# Patient Record
Sex: Male | Born: 1985 | Hispanic: No | Marital: Single | Smoking: Never smoker
Health system: Southern US, Community
[De-identification: ages and names within clinical notes are randomized; demographics above are authoritative.]

## PROBLEM LIST (undated history)

## (undated) DIAGNOSIS — E782 Mixed hyperlipidemia: Secondary | ICD-10-CM

## (undated) DIAGNOSIS — M549 Dorsalgia, unspecified: Secondary | ICD-10-CM

## (undated) HISTORY — DX: Dorsalgia, unspecified: M54.9

## (undated) HISTORY — DX: Mixed hyperlipidemia: E78.2

---

## 2013-02-01 ENCOUNTER — Encounter (HOSPITAL_COMMUNITY): Payer: Self-pay

## 2013-02-01 ENCOUNTER — Emergency Department (INDEPENDENT_AMBULATORY_CARE_PROVIDER_SITE_OTHER): Payer: BC Managed Care – PPO

## 2013-02-01 ENCOUNTER — Emergency Department (HOSPITAL_COMMUNITY)
Admission: EM | Admit: 2013-02-01 | Discharge: 2013-02-01 | Disposition: A | Discharge status: Home / Self Care | Payer: BC Managed Care – PPO | Attending: Emergency Medicine | Admitting: Emergency Medicine

## 2013-02-01 ENCOUNTER — Emergency Department (HOSPITAL_COMMUNITY): Payer: BC Managed Care – PPO

## 2013-02-01 ENCOUNTER — Encounter (HOSPITAL_COMMUNITY): Payer: Self-pay | Admitting: Emergency Medicine

## 2013-02-01 ENCOUNTER — Emergency Department (HOSPITAL_COMMUNITY)
Admission: EM | Admit: 2013-02-01 | Discharge: 2013-02-01 | Disposition: A | Discharge status: Nursing Home (Includes ICF) | Payer: BC Managed Care – PPO | Source: Home / Self Care | Attending: Emergency Medicine | Admitting: Emergency Medicine

## 2013-02-01 DIAGNOSIS — J9383 Other pneumothorax: Secondary | ICD-10-CM

## 2013-02-01 DIAGNOSIS — R319 Hematuria, unspecified: Secondary | ICD-10-CM | POA: Insufficient documentation

## 2013-02-01 DIAGNOSIS — R109 Unspecified abdominal pain: Secondary | ICD-10-CM

## 2013-02-01 DIAGNOSIS — R1013 Epigastric pain: Secondary | ICD-10-CM

## 2013-02-01 DIAGNOSIS — Z79899 Other long term (current) drug therapy: Secondary | ICD-10-CM | POA: Insufficient documentation

## 2013-02-01 DIAGNOSIS — K219 Gastro-esophageal reflux disease without esophagitis: Secondary | ICD-10-CM

## 2013-02-01 DIAGNOSIS — J939 Pneumothorax, unspecified: Secondary | ICD-10-CM

## 2013-02-01 DIAGNOSIS — M549 Dorsalgia, unspecified: Secondary | ICD-10-CM

## 2013-02-01 LAB — POCT URINALYSIS DIP (DEVICE)
Glucose, UA: NEGATIVE mg/dL
Nitrite: NEGATIVE
Protein, ur: NEGATIVE mg/dL
Urobilinogen, UA: 0.2 mg/dL (ref 0.0–1.0)

## 2013-02-01 LAB — POCT I-STAT, CHEM 8
Creatinine, Ser: 1.1 mg/dL (ref 0.50–1.35)
Hemoglobin: 15 g/dL (ref 13.0–17.0)
Potassium: 4 mEq/L (ref 3.5–5.1)
Sodium: 141 mEq/L (ref 135–145)
TCO2: 24 mmol/L (ref 0–100)

## 2013-02-01 LAB — CBC WITH DIFFERENTIAL/PLATELET
Basophils Absolute: 0 10*3/uL (ref 0.0–0.1)
Eosinophils Absolute: 0.1 10*3/uL (ref 0.0–0.7)
Eosinophils Relative: 2 % (ref 0–5)
Lymphocytes Relative: 31 % (ref 12–46)
Lymphs Abs: 2.1 10*3/uL (ref 0.7–4.0)
MCV: 84.7 fL (ref 78.0–100.0)
Neutrophils Relative %: 57 % (ref 43–77)
Platelets: 174 10*3/uL (ref 150–400)
RBC: 4.78 MIL/uL (ref 4.22–5.81)
RDW: 13 % (ref 11.5–15.5)
WBC: 6.7 10*3/uL (ref 4.0–10.5)

## 2013-02-01 LAB — POCT H PYLORI SCREEN: H. PYLORI SCREEN, POC: NEGATIVE

## 2013-02-01 MED ORDER — PANTOPRAZOLE SODIUM 20 MG PO TBEC: 20.0000 mg | DELAYED_RELEASE_TABLET | Freq: Every day | ORAL | Status: DC

## 2013-02-01 MED ORDER — ACETAMINOPHEN 325 MG PO TABS
650.0000 mg | ORAL_TABLET | Freq: Once | ORAL | Status: AC
  Administered 2013-02-01: 650 mg via ORAL

## 2013-02-01 MED ORDER — ACETAMINOPHEN 325 MG PO TABS
ORAL_TABLET | ORAL | Status: AC
  Filled 2013-02-01: qty 2

## 2013-02-01 MED ORDER — GI COCKTAIL ~~LOC~~
30.0000 mL | Freq: Once | ORAL | Status: AC
  Administered 2013-02-01: 30 mL via ORAL

## 2013-02-01 MED ORDER — GI COCKTAIL ~~LOC~~
ORAL | Status: AC
  Filled 2013-02-01: qty 30

## 2013-02-01 NOTE — ED Notes (Signed)
Transfer from [facility]. Report given by Cherly Anderson. Pt presents with back pain that radiates to abdomen for "a long time". Urine dip stick performed at [facility] for Hgb. Acute abdomen and chest xray showed small pneumothorax. Pt denies CP and SOB. H.Pylori performed and was negative. I stat chem 8 result Ca+ 1.31. CBC WNL. Pt vital signs: 124/79, temp. 99.4 orally, HR 84, RR 16, O2 sats 100%. Pt given 2 regular tylenol and a GI Cocktail.

## 2013-02-01 NOTE — ED Notes (Signed)
C/o back pain radiating to abd area for a long time.  No treatment taken

## 2013-02-01 NOTE — ED Notes (Addendum)
Patient presents via [facility] for further evaluation of epigastric and lower back pain x 1 month. A abdomen w/ chest xray was done that found a right pneumothorax. Patient denies any CP or SOB. Denies any recent falls or trauma. Patient did travel via car to [place] about a week ago.   CBC, H. Pylori screen, I-stat Chem 8 and U/A completed at urgent care.

## 2013-02-01 NOTE — ED Provider Notes (Signed)
CSN: [id number]     Arrival date & time 02/01/13  2001 History     First MD Initiated Contact with Patient 02/01/13 2055     Chief Complaint  Patient presents with  . Spontaneous Pneumothorax   (Consider location/radiation/quality/duration/timing/severity/associated sxs/prior Treatment) HPI Comments: 27 y.o. M transferred from [facility] due to possible R apical PTX found incidentally on acute abdominal series.  Pt initially presented to [facility] due to abdominal pain x 1 month.  Patient is a 27 y.o. male presenting with abdominal pain.  Abdominal Pain Pain location:  Epigastric Pain quality: burning   Pain radiates to:  Does not radiate Pain severity:  Mild Onset quality:  Gradual Duration:  4 weeks Timing:  Intermittent Progression:  Unchanged Chronicity:  Chronic Context: eating   Context: not alcohol use, not awakening from sleep, not recent illness, not retching, not sick contacts, not suspicious food intake and not trauma   Relieved by:  None tried Worsened by:  Nothing tried Ineffective treatments:  None tried Associated symptoms: no anorexia, no chest pain, no chills, no constipation, no cough, no diarrhea, no dysuria, no fatigue, no fever, no hematemesis, no hematochezia, no hematuria, no melena, no nausea, no shortness of breath and no vomiting     History reviewed. No pertinent past medical history. History reviewed. No pertinent past surgical history. No family history on file. History  Substance Use Topics  . Smoking status: Never Smoker   . Smokeless tobacco: Never Used  . Alcohol Use: No    Review of Systems  Constitutional: Negative for fever, chills, diaphoresis, appetite change and fatigue.  HENT: Negative for congestion, rhinorrhea, neck pain and neck stiffness.   Respiratory: Negative for cough, chest tightness, shortness of breath, wheezing and stridor.   Cardiovascular: Negative for chest pain.  Gastrointestinal: Positive for abdominal pain. Negative for  nausea, vomiting, diarrhea, constipation, blood in stool, melena, hematochezia, abdominal distention, anorexia and hematemesis.  Genitourinary: Negative for dysuria, urgency, frequency, hematuria, decreased urine volume, difficulty urinating and penile pain.  Musculoskeletal: Negative for myalgias, back pain, joint swelling and arthralgias.  Skin: Negative for color change, pallor and rash.  Neurological: Negative for dizziness, light-headedness and headaches.  All other systems reviewed and are negative.    Allergies  Review of patient's allergies indicates no known allergies.  Home Medications   Current Outpatient Rx  Name  Route  Sig  Dispense  Refill  . pantoprazole (PROTONIX) 20 MG tablet   Oral   Take 1 tablet (20 mg total) by mouth daily.   30 tablet   0    BP 138/91  Pulse 66  Temp(Src) 97.8 F (36.6 C) (Oral)  Resp 16  SpO2 100% Physical Exam  Nursing note and vitals reviewed. Constitutional: He is oriented to person, place, and time. He appears well-developed and well-nourished. No distress.  HENT:  Head: Normocephalic and atraumatic.  Eyes: EOM are normal. Pupils are equal, round, and reactive to light.  Neck: Normal range of motion. Neck supple.  Cardiovascular: Normal rate, regular rhythm, normal heart sounds and intact distal pulses.   Pulmonary/Chest: Effort normal and breath sounds normal. No respiratory distress. He has no wheezes. He has no rales.  Abdominal: Soft. Normal appearance and bowel sounds are normal. He exhibits no distension. There is no tenderness. There is no rigidity, no rebound, no guarding, no CVA tenderness, no tenderness at McBurney's point and negative Murphy's sign.  Musculoskeletal: Normal range of motion. He exhibits no edema and no tenderness.  Neurological:  He is alert and oriented to person, place, and time. He has normal strength. He exhibits normal muscle tone. Coordination normal.  Skin: Skin is warm and dry. No rash noted. He is  not diaphoretic.    ED Course   Procedures (including critical care time)  Labs Reviewed - No data to display Dg Chest 1 View  02/01/2013   *RADIOLOGY REPORT*  Clinical Data: Reevaluate for possible pneumothorax  CHEST - 1 VIEW  Comparison: Single chest radiograph same date  Findings: Cardiomediastinal silhouette is within normal limits. The lungs are clear. No pleural effusion.  No pneumothorax.  No acute osseous abnormality.  No specific evidence for right apical pneumothorax.  IMPRESSION: No acute abnormality.  The previously questioned pneumothorax appears to have been artifactual.   Original Report Authenticated By: Christiana Pellant, M.D.   Dg Abd Acute W/chest  02/01/2013   *RADIOLOGY REPORT*  Clinical Data: Abdominal and back pain for 1 month  ACUTE ABDOMEN SERIES (ABDOMEN 2 VIEW & CHEST 1 VIEW)  Comparison: None.  Findings: Normal heart size.  Clear lungs. Linear interface at the right apex is present.  Pneumothorax is not excluded.  There is no free intraperitoneal gas.  No disproportionate dilatation of bowel.  Unremarkable soft tissues peri  IMPRESSION: Nonobstructive bowel gas pattern.  Possible right apical pneumothorax.  Upright PA chest is recommended to further characterize.   Original Report Authenticated By: Jolaine Click, M.D.   1. Hematuria   2. GERD (gastroesophageal reflux disease)     MDM  27 y.o. M transferred from [facility] due to possible R apical PTX found as incidental finding on acute abdominal series.  Pt has had no complaints of chest pain or dyspnea, no cough or pleuritic pain.  No hypoxia noted at [facility].    Pt presented to [facility] due to 1 month of epigastric abdominal, lower abdominal, and low back pain.  He was evaluated at [facility] where labwork revealed moderate hematuria.  Chem 8 and CBC was WNL. H.pylori was negative. Pt reports epigastric pain is burning in nature, worse with cold foods or liquids.  He states he has had back and abdominal pain intermittently for the past  month.  Pain bilateral, with radiation to suprapubic area.  He also reports occasional diarrhea although denies this currently.  He denies dysuria, hematuria, hematochezia, or melena.  Denies N/V.  Acute abd series was obtained which incidentally showed possible R apical PTX.    On arrival, pt AFVSS, no tachypnea or hypoxia, O2 sats 100% on RA.  On exam, lung sounds clear and equal bilaterally.  Will repeat upright PA CXR to reassess for PTX.  Abdominal exam with no acute findings, non-tender with no rebound or guarding.  No CVA TTP. U/A from [facility] with hematuria but otherwise with no evidence of infection.  No focal RLQ TTP, rebound, guarding, or leukocytosis to suggest appendicitis.  Murphy's sign negative, doubt cholecystitis.     CXR normal, previously noted possible PTX appears to be artifact.  Pt stable for discharge.  Abdominal exam benign, symptoms consistent with GERD.  Pt prescribed PPI and advised f/u with PCP.  Hematuria on U/A may be 2/2 pt's heavy lifting and physical activity he does for work.  Contact info provided for PCP.  ED return precautions given.  Pt stable at discharge.  Discussed with attending Dr. Romeo Apple.  Jodean Lima, MD 02/02/13 0000

## 2013-02-01 NOTE — ED Notes (Signed)
Patient stated that he was not currently in pain but was experiencing pain bilaterally on his sides and back,

## 2013-02-01 NOTE — ED Provider Notes (Signed)
Chief Complaint:   Chief Complaint  Patient presents with  . Back Pain    History of Present Illness:    Juan Navarro is a 27 year old Nepali male who presents tonight with a one-month history of daily lower back, lower abdominal, and epigastric pain. The pains can last from 5-10 minutes at a time and come and go throughout the day. This feels like a hot, burning pain, rated 8-9/10 in intensity. The pain is worse with heavy work, cold liquids, or spicy foods. He denies any fever, chills, anorexia, weight loss. He's had no nausea or vomiting. He denies any constipation or blood in the stool. He has had some diarrhea. He denies any urinary symptoms. He denies any chest pain or shortness of breath. There's been no trauma to his chest. He denies any history of ulcer disease, gallstones, liver disease, or any other GI problems.  Review of Systems:  Other than noted above, the patient denies any of the following symptoms: Constitutional:  No fever, chills, fatigue, weight loss or anorexia. Lungs:  No cough or shortness of breath. Heart:  No chest pain, palpitations, syncope or edema.  No cardiac history. Abdomen:  No nausea, vomiting, hematememesis, melena, diarrhea, or hematochezia. GU:  No dysuria, frequency, urgency, or hematuria.  No testicular pain or swelling.  PMFSH:  Past medical history, family history, social history, meds, and allergies were reviewed along with nurse's notes.  No prior abdominal surgeries or history of GI problems.  No use of NSAIDs or aspirin.  No excessive  alcohol intake.   Physical Exam:   Vital signs:  BP 124/79  Pulse 84  Temp(Src) 99.4 F (37.4 C) (Oral)  Resp 16  SpO2 100% Gen:  Alert, oriented, in no distress. Lungs:  Breath sounds clear and equal bilaterally.  No wheezes, rales or rhonchi. Heart:  Regular rhythm.  No gallops or murmers.   Abdomen:  Soft, flat, nondistended. He has tenderness to palpation in the epigastrium and both lower quadrants which is  mild to moderate and not associated with guarding or rebound. There is no organomegaly or mass and bowel sounds are normal. Skin:  Clear, warm and dry.  No rash.  Labs:   Results for orders placed during the hospital encounter of 02/01/13  CBC WITH DIFFERENTIAL      Result Value Range   WBC 6.7  4.0 - 10.5 K/uL   RBC 4.78  4.22 - 5.81 MIL/uL   Hemoglobin 13.9  13.0 - 17.0 g/dL   HCT 98.1  19.1 - 47.8 %   MCV 84.7  78.0 - 100.0 fL   MCH 29.1  26.0 - 34.0 pg   MCHC 34.3  30.0 - 36.0 g/dL   RDW 29.5  62.1 - 30.8 %   Platelets 174  150 - 400 K/uL   Neutrophils Relative % 57  43 - 77 %   Neutro Abs 3.8  1.7 - 7.7 K/uL   Lymphocytes Relative 31  12 - 46 %   Lymphs Abs 2.1  0.7 - 4.0 K/uL   Monocytes Relative 10  3 - 12 %   Monocytes Absolute 0.7  0.1 - 1.0 K/uL   Eosinophils Relative 2  0 - 5 %   Eosinophils Absolute 0.1  0.0 - 0.7 K/uL   Basophils Relative 1  0 - 1 %   Basophils Absolute 0.0  0.0 - 0.1 K/uL  POCT URINALYSIS DIP (DEVICE)      Result Value Range   Glucose, UA NEGATIVE  NEGATIVE mg/dL   Bilirubin Urine NEGATIVE  NEGATIVE   Ketones, ur NEGATIVE  NEGATIVE mg/dL   Specific Gravity, Urine <=1.005  1.005 - 1.030   Hgb urine dipstick MODERATE (*) NEGATIVE   pH 6.0  5.0 - 8.0   Protein, ur NEGATIVE  NEGATIVE mg/dL   Urobilinogen, UA 0.2  0.0 - 1.0 mg/dL   Nitrite NEGATIVE  NEGATIVE   Leukocytes, UA NEGATIVE  NEGATIVE  POCT I-STAT, CHEM 8      Result Value Range   Sodium 141  135 - 145 mEq/L   Potassium 4.0  3.5 - 5.1 mEq/L   Chloride 105  96 - 112 mEq/L   BUN 13  6 - 23 mg/dL   Creatinine, Ser 8.46  0.50 - 1.35 mg/dL   Glucose, Bld 91  70 - 99 mg/dL   Calcium, Ion 9.62 (*) 1.12 - 1.23 mmol/L   TCO2 24  0 - 100 mmol/L   Hemoglobin 15.0  13.0 - 17.0 g/dL   HCT 95.2  84.1 - 32.4 %  POCT H PYLORI SCREEN      Result Value Range   H. PYLORI SCREEN, POC NEGATIVE  NEGATIVE     Radiology:  Dg Abd Acute W/chest  02/01/2013   *RADIOLOGY REPORT*  Clinical Data: Abdominal  and back pain for 1 month  ACUTE ABDOMEN SERIES (ABDOMEN 2 VIEW & CHEST 1 VIEW)  Comparison: None.  Findings: Normal heart size.  Clear lungs. Linear interface at the right apex is present.  Pneumothorax is not excluded.  There is no free intraperitoneal gas.  No disproportionate dilatation of bowel.  Unremarkable soft tissues peri  IMPRESSION: Nonobstructive bowel gas pattern.  Possible right apical pneumothorax.  Upright PA chest is recommended to further characterize.   Original Report Authenticated By: Jolaine Click, M.D.    Course in Urgent Care Center:   He was given a dose of GI cocktail and Tylenol 650 mg by mouth.  Assessment:  The primary encounter diagnosis was Back pain. Diagnoses of Epigastric pain, Lower abdominal pain, unspecified laterality, Hematuria, and Pneumothorax were also pertinent to this visit.  The finding of the pneumothorax was totally unexpected and is asymptomatic from this. I cannot explain how it fingers into his symptom picture. It may just be a totally unrelated finding.  The back and lower abdominal pain still remains problematic. Differential diagnosis includes kidney stone, mechanical back pain, irritable bowel syndrome, inflammatory bowel disease, or bacterial gastroenteritis.  Plan:   1.  The following meds were prescribed:   New Prescriptions   No medications on file   2.  The patient was transferred to the emergency department via shuttle in stable condition for further evaluation, specifically for his pneumothorax.  Medical Decision Making:  27 year old male presents with a 1 month history of intermittent lower back, lower abdominal, and epigastric pain.  No fever, nausea, vomiting, weight loss, diarrhea, constipation, or urinary symptoms.  Workup reveals hematuria, and a totally unexpected finding of a right pneumothorax.  We are transferring him for further evaluation of the pneumothorax, and further workup of the abdominal pain, the back pain, and the  hematuria.          Reuben Likes, MD 02/01/13 [phone]

## 2013-02-01 NOTE — ED Notes (Signed)
Informed EDP of patient. Instructed to order PA upright to evaluate for possible right apical pneumothorax and to place patient on O2. Patient remains asymptomatic. O2 via [place] @ 2L applied per MD order. Dr. Artis Flock at bedside and updated.

## 2013-02-03 NOTE — ED Provider Notes (Signed)
Medical screening examination/treatment/procedure(s) were conducted as a shared visit with non-physician practitioner(s) and myself.  I personally evaluated the patient during the encounter  Pt denies abd pain on my exam. He appears well. Ptx seen previously now noted to be artifact.   Juan Argyle, MD 02/03/13 1059

## 2013-09-30 ENCOUNTER — Encounter: Payer: Self-pay | Admitting: Medical

## 2013-10-07 ENCOUNTER — Emergency Department (HOSPITAL_COMMUNITY)
Admission: EM | Admit: 2013-10-07 | Discharge: 2013-10-07 | Disposition: A | Discharge status: Home / Self Care | Payer: BC Managed Care – PPO | Source: Home / Self Care | Attending: Family Medicine | Admitting: Family Medicine

## 2013-10-07 ENCOUNTER — Encounter (HOSPITAL_COMMUNITY): Payer: Self-pay | Admitting: Emergency Medicine

## 2013-10-07 DIAGNOSIS — R319 Hematuria, unspecified: Secondary | ICD-10-CM

## 2013-10-07 DIAGNOSIS — R109 Unspecified abdominal pain: Secondary | ICD-10-CM

## 2013-10-07 DIAGNOSIS — M545 Low back pain, unspecified: Secondary | ICD-10-CM

## 2013-10-07 LAB — POCT URINALYSIS DIP (DEVICE)
Bilirubin Urine: NEGATIVE
GLUCOSE, UA: NEGATIVE mg/dL
Ketones, ur: NEGATIVE mg/dL
Leukocytes, UA: NEGATIVE
NITRITE: NEGATIVE
Protein, ur: 30 mg/dL — AB
Specific Gravity, Urine: 1.02 (ref 1.005–1.030)
UROBILINOGEN UA: 0.2 mg/dL (ref 0.0–1.0)
pH: 6 (ref 5.0–8.0)

## 2013-10-07 LAB — COMPREHENSIVE METABOLIC PANEL
ALT: 15 U/L (ref 0–53)
AST: 19 U/L (ref 0–37)
Albumin: 3.9 g/dL (ref 3.5–5.2)
Alkaline Phosphatase: 75 U/L (ref 39–117)
BILIRUBIN TOTAL: 0.3 mg/dL (ref 0.3–1.2)
BUN: 16 mg/dL (ref 6–23)
CHLORIDE: 103 meq/L (ref 96–112)
CO2: 24 meq/L (ref 19–32)
CREATININE: 1.05 mg/dL (ref 0.50–1.35)
Calcium: 9.8 mg/dL (ref 8.4–10.5)
GLUCOSE: 93 mg/dL (ref 70–99)
Potassium: 4.6 mEq/L (ref 3.7–5.3)
Sodium: 140 mEq/L (ref 137–147)
Total Protein: 7.8 g/dL (ref 6.0–8.3)

## 2013-10-07 LAB — LIPASE, BLOOD: LIPASE: 49 U/L (ref 11–59)

## 2013-10-07 MED ORDER — PANTOPRAZOLE SODIUM 20 MG PO TBEC: 20.0000 mg | DELAYED_RELEASE_TABLET | Freq: Every day | ORAL | Status: DC

## 2013-10-07 MED ORDER — DICLOFENAC SODIUM 50 MG PO TBEC: 50.0000 mg | DELAYED_RELEASE_TABLET | Freq: Two times a day (BID) | ORAL | Status: DC | PRN

## 2013-10-07 NOTE — Discharge Instructions (Signed)
Thank you for coming in today. For back pain use a heating pad and diclofenac as needed.  [facility]up with [facility] for blood in your urine. This may also explain your abdominal pain.  Please establish with a primary care provider.  If your belly pain worsens, or you have high fever, bad vomiting, blood in your stool or black tarry stool go to the Emergency Room.  Come back or go to the emergency room if you notice new weakness new numbness problems walking or bowel or bladder problems.  Abdominal Pain, Adult Many things can cause abdominal pain. Usually, abdominal pain is not caused by a disease and will improve without treatment. It can often be observed and treated at home. Your health care provider will do a physical exam and possibly order blood tests and X-rays to help determine the seriousness of your pain. However, in many cases, more time must pass before a clear cause of the pain can be found. Before that point, your health care provider may not know if you need more testing or further treatment. HOME CARE INSTRUCTIONS  Monitor your abdominal pain for any changes. The following actions may help to alleviate any discomfort you are experiencing:  Only take over-the-counter or prescription medicines as directed by your health care provider.  Do not take laxatives unless directed to do so by your health care provider.  Try a clear liquid diet (broth, tea, or water) as directed by your health care provider. Slowly move to a bland diet as tolerated. SEEK MEDICAL CARE IF:  You have unexplained abdominal pain.  You have abdominal pain associated with nausea or diarrhea.  You have pain when you urinate or have a bowel movement.  You experience abdominal pain that wakes you in the night.  You have abdominal pain that is worsened or improved by eating food.  You have abdominal pain that is worsened with eating fatty foods. SEEK IMMEDIATE MEDICAL CARE IF:   Your pain does not go away  within 2 hours.  You have a fever.  You keep throwing up (vomiting).  Your pain is felt only in portions of the abdomen, such as the right side or the left lower portion of the abdomen.  You pass bloody or black tarry stools. MAKE SURE YOU:  Understand these instructions.   Will watch your condition.   Will get help right away if you are not doing well or get worse.  Document Released: 03/19/2005 Document Revised: 03/30/2013 Document Reviewed: 02/16/2013 [facility] Center[facility] Patient Information 2014 [place], [place].

## 2013-10-07 NOTE — ED Notes (Signed)
C/o abd pain and back pain States he is nausea States pain comes and goes especially when he eats cold food States bowel movements are regular States lower back hurts Does not do heavy lifting daily at work

## 2013-10-07 NOTE — ED Provider Notes (Signed)
Juan Navarro is a 28 y.o. male who presents to Urgent Care today for back pain and abdominal pain. 1) patient is a 3 days of low back pain. The pain has occurred in the low back without radiation weakness or numbness. No bowel bladder dysfunction or difficulty walking. The pain is moderate and worse with activities. He has not tried any medications yet. 2) additionally patient notes lower abdominal pain. This is been present for many months. The pain is worse after eating or drinking cold food. He denies any nausea vomiting or diarrhea. He feels well otherwise. The pain is intermittent in nature. He denies any urinary frequency dysuria or urgency.    History reviewed. No pertinent past medical history. History  Substance Use Topics  . Smoking status: Never Smoker   . Smokeless tobacco: Never Used  . Alcohol Use: No   ROS as above Medications: No current facility-administered medications for this encounter.   Current Outpatient Prescriptions  Medication Sig Dispense Refill  . diclofenac (VOLTAREN) 50 MG EC tablet Take 1 tablet (50 mg total) by mouth 2 (two) times daily as needed.  60 tablet  0  . pantoprazole (PROTONIX) 20 MG tablet Take 1 tablet (20 mg total) by mouth daily.  30 tablet  0    Exam:  BP 135/73  Pulse 87  Temp(Src) 97.9 F (36.6 C) (Oral)  Resp 18  SpO2 100% Gen: Well NAD HEENT: EOMI,  MMM Lungs: Normal work of breathing. CTABL Heart: RRR no MRG Abd: NABS, Soft. NT, ND no CV angle tenderness to percussion Exts: Brisk capillary refill, warm and well perfused.  Back: Nontender to spinal midline. Tender palpation left SI joint. Positive pretzel stretch. Negative straight leg and Faber test bilaterally.  Lower extremity strength is intact. Laboratory sensation is intact throughout. Reflexes are equal and normal bilateral lower extremities. Normal gait.  Results for orders placed during the hospital encounter of 10/07/13 (from the past 24 hour(s))  POCT URINALYSIS  DIP (DEVICE)     Status: Abnormal   Collection Time    10/07/13 12:00 PM      Result Value Ref Range   Glucose, UA NEGATIVE  NEGATIVE mg/dL   Bilirubin Urine NEGATIVE  NEGATIVE   Ketones, ur NEGATIVE  NEGATIVE mg/dL   Specific Gravity, Urine 1.020  1.005 - 1.030   Hgb urine dipstick LARGE (*) NEGATIVE   pH 6.0  5.0 - 8.0   Protein, ur 30 (*) NEGATIVE mg/dL   Urobilinogen, UA 0.2  0.0 - 1.0 mg/dL   Nitrite NEGATIVE  NEGATIVE   Leukocytes, UA NEGATIVE  NEGATIVE   No results found.  Assessment and Plan: 28 y.o. male with  1) lumbago: SI joint dysfunction. NSAIDs heating pad and rest.  2) pelvic pain: Unclear etiology. Possibly associated with hematuria. Refer to urology as patient does not have PCP.    Discussed warning signs or symptoms. Please see discharge instructions. Patient expresses understanding.    Rodolph BongEvan S Darrick Greenlaw, MD 10/07/13 [phone]

## 2013-10-21 ENCOUNTER — Encounter: Payer: Self-pay | Admitting: Medical

## 2014-01-02 ENCOUNTER — Encounter: Payer: Self-pay | Admitting: Medical

## 2014-01-02 ENCOUNTER — Ambulatory Visit (INDEPENDENT_AMBULATORY_CARE_PROVIDER_SITE_OTHER): Payer: BC Managed Care – PPO | Admitting: Medical

## 2014-01-02 VITALS — BP 112/70 | HR 68 | Temp 97.4°F | Resp 10 | Ht 69.0 in | Wt 168.0 lb

## 2014-01-02 DIAGNOSIS — Z0000 Encounter for general adult medical examination without abnormal findings: Secondary | ICD-10-CM

## 2014-01-02 DIAGNOSIS — M545 Low back pain, unspecified: Secondary | ICD-10-CM

## 2014-01-02 DIAGNOSIS — R109 Unspecified abdominal pain: Secondary | ICD-10-CM

## 2014-01-02 DIAGNOSIS — R3129 Other microscopic hematuria: Secondary | ICD-10-CM

## 2014-01-02 LAB — CBC
HCT: 42.6 % (ref 39.0–52.0)
Hemoglobin: 15.1 g/dL (ref 13.0–17.0)
MCH: 29.4 pg (ref 26.0–34.0)
MCHC: 35.4 g/dL (ref 30.0–36.0)
MCV: 82.9 fL (ref 78.0–100.0)
PLATELETS: 180 10*3/uL (ref 150–400)
RBC: 5.14 MIL/uL (ref 4.22–5.81)
RDW: 14.7 % (ref 11.5–15.5)
WBC: 6.5 10*3/uL (ref 4.0–10.5)

## 2014-01-02 LAB — POCT URINALYSIS DIPSTICK
Bilirubin, UA: NEGATIVE
Glucose, UA: NEGATIVE
Ketones, UA: NEGATIVE
LEUKOCYTES UA: NEGATIVE
Nitrite, UA: NEGATIVE
Spec Grav, UA: 1.01
UROBILINOGEN UA: NEGATIVE
pH, UA: 5

## 2014-01-02 MED ORDER — OMEPRAZOLE 40 MG PO CPDR: DELAYED_RELEASE_CAPSULE | ORAL | Status: DC

## 2014-01-02 NOTE — Progress Notes (Signed)
Subjective:   HPI  Juan Navarro is a 28 y.o. male who presents for a complete physical.  New patient today.   Here with the translator.    When lifts heavy things, gets back pain.  Day to day activity is fine.  Has seen urgent care prior for this 2 times.  Was given medication for back ache and things got better.  [place] to [place] in 07[place].  Also has some stomach concerns. Gets "gastritis."   Has BM daily.  Has occasionally seen bright red blood in stool.  Not regularly, only has happened a few times.   Denies using regularly NSAIDs.  Belly pain worse if hungry and hasn't eaten in a while.   Worse fasting.   Tried a medication from Urgent Care for gastritis.   Eats 3 meals + snacks daily.  Eats a lot of lentils and rice and vegetables.  Sometimes eats oily things.  Eats meat regularly, chicken and goat.  Sometimes fried foods at the hotel.    Medical care team includes:  Establishing today with me for primary care.   Preventative care: Last ophthalmology visit:  n/a Last dental visit: has been once since 2012.  Brushes teeth regularly.  Prior vaccinations: up to date when came here to [place] in 2012.   Reviewed their medical, surgical, family, social, medication, and allergy history and updated chart as appropriate.  Past Medical History  Diagnosis Date  . Back pain     History reviewed. No pertinent past surgical history.  History   Social History  . Marital Status: Single    Spouse Name: N/A    Number of Children: N/A  . Years of Education: N/A   Occupational History  . Not on file.   Social History Main Topics  . Smoking status: Never Smoker   . Smokeless tobacco: Never Used  . Alcohol Use: No  . Drug Use: No  . Sexual Activity: Not on file   Other Topics Concern  . Not on file   Social History Narrative   Married, no children, no plans for any children currently, exercise - running 2-3 days per week, originally from [place].  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      Family History  Problem Relation Age of Onset  . Other Mother     was treated for TB when she came to [place]  . Other Father     died accidental death  . Cancer Neg Hx   . Heart disease Neg Hx   . Stroke Neg Hx   . Diabetes Neg Hx     Current outpatient prescriptions:omeprazole (PRILOSEC) 40 MG capsule, 1 tablet daily 45 min before breakfast, Disp: 30 capsule, Rfl: 0  No Known Allergies     Review of Systems Constitutional: -fever, -chills, -sweats, -unexpected weight change, -decreased appetite, -fatigue Allergy: -sneezing, -itching, -congestion Dermatology: -changing moles, --rash, -lumps ENT: -runny nose, -ear pain, -sore throat, -hoarseness, -sinus pain, -teeth pain, - ringing in ears, -hearing loss, -nosebleeds Cardiology: -chest pain, -palpitations, -swelling, -difficulty breathing when lying flat, -waking up short of breath Respiratory: -cough, -shortness of breath, -difficulty breathing with exercise or exertion, -wheezing, -coughing up blood Gastroenterology: +abdominal pain, -nausea, -vomiting, -diarrhea, -constipation, +blood in stool, -changes in bowel movement, -difficulty swallowing or eating Hematology: -bleeding, -bruising  Musculoskeletal: -joint aches, -muscle aches, -joint swelling, -back pain, -neck pain, -cramping, -changes in gait Ophthalmology: denies vision changes, eye redness, itching, discharge Urology: -burning with urination, -difficulty urinating, -  blood in urine, -urinary frequency, -urgency, -incontinence Neurology: -headache, -weakness, -tingling, -numbness, -memory loss, -falls, -dizziness Psychology: -depressed mood, -agitation, -sleep problems     Objective:   Physical Exam  BP 112/70  Pulse 68  Temp(Src) 97.4 F (36.3 C)  Resp 10  Ht 5\' 9"  (1.753 m)  Wt 168 lb (76.204 kg)  BMI 24.80 kg/m2  General appearance: alert, no distress, WD/WN, [place] male Skin: few scattered benign appearing macules, no worrisome  lesions HEENT: normocephalic, conjunctiva/corneas normal, sclerae anicteric, PERRLA, EOMi, nares patent, no discharge or erythema, pharynx normal Oral cavity: MMM, tongue normal, teeth in good repair Neck: supple, no lymphadenopathy, no thyromegaly, no masses, normal ROM, no bruits Chest: non tender, normal shape and expansion Heart: RRR, normal S1, S2, no murmurs Lungs: CTA bilaterally, no wheezes, rhonchi, or rales Abdomen: +bs, soft, mild LLQ tenderness, otherwise non tender, non distended, no masses, no hepatomegaly, no splenomegaly, no bruits Back: non tender, normal ROM, no scoliosis Musculoskeletal: upper extremities non tender, no obvious deformity, normal ROM throughout, lower extremities non tender, no obvious deformity, normal ROM throughout Extremities: no edema, no cyanosis, no clubbing Pulses: 2+ symmetric, upper and lower extremities, normal cap refill Neurological: alert, oriented x 3, CN2-12 intact, strength normal upper extremities and lower extremities, sensation normal throughout, DTRs 2+ throughout, no cerebellar signs, gait normal Psychiatric: normal affect, behavior normal, pleasant  GU: normal male external genitalia, uncircumcised, nontender, no masses, no hernia, no lymphadenopathy Rectal: anus normal appearing, no hemorrhoid or other lesions   Assessment and Plan :      Encounter Diagnoses  Name Primary?  . Routine general medical examination at a health care facility Yes  . Microscopic hematuria   . Abdominal pain, unspecified site   . Bilateral low back pain without sciatica     Physical exam - discussed healthy lifestyle, diet, exercise, preventative care, vaccinations, and addressed their concerns.    Microscopic hematuria-urinalysis positive for blood. Urine microscopic today shows rare RBC, seems to be false + on dipstick.  For now, discussed causes, recheck in 67mo.  Abdominal pain-reviewed prior urgent care notes, he failed protonix, trial of  omeprazole although the etiology is not clear.  Consider GI referral going forward.  Back pain - discussed back stretches, strengthening exercises, prevention.  Follow-up pending labs

## 2014-01-03 LAB — BASIC METABOLIC PANEL
BUN: 10 mg/dL (ref 6–23)
CHLORIDE: 100 meq/L (ref 96–112)
CO2: 27 mEq/L (ref 19–32)
CREATININE: 0.86 mg/dL (ref 0.50–1.35)
Calcium: 9.4 mg/dL (ref 8.4–10.5)
Glucose, Bld: 81 mg/dL (ref 70–99)
POTASSIUM: 4.3 meq/L (ref 3.5–5.3)
Sodium: 139 mEq/L (ref 135–145)

## 2014-01-03 LAB — LIPID PANEL
CHOLESTEROL: 208 mg/dL — AB (ref 0–200)
HDL: 31 mg/dL — AB (ref >39)
TRIGLYCERIDES: 695 mg/dL — AB (ref <150)
Total CHOL/HDL Ratio: 6.7 Ratio

## 2014-01-03 LAB — TSH: TSH: 2.906 u[IU]/mL (ref 0.350–4.500)

## 2014-03-06 ENCOUNTER — Emergency Department (HOSPITAL_COMMUNITY)
Admission: EM | Admit: 2014-03-06 | Discharge: 2014-03-06 | Disposition: A | Discharge status: Home / Self Care | Payer: BC Managed Care – PPO | Source: Home / Self Care | Attending: Family Medicine | Admitting: Family Medicine

## 2014-03-06 ENCOUNTER — Encounter (HOSPITAL_COMMUNITY): Payer: Self-pay | Admitting: Emergency Medicine

## 2014-03-06 ENCOUNTER — Ambulatory Visit (HOSPITAL_COMMUNITY)
Admission: RE | Admit: 2014-03-06 | Discharge: 2014-03-06 | Disposition: A | Discharge status: Home / Self Care | Payer: BC Managed Care – PPO | Source: Ambulatory Visit | Attending: Emergency Medicine | Admitting: Emergency Medicine

## 2014-03-06 DIAGNOSIS — R0789 Other chest pain: Secondary | ICD-10-CM

## 2014-03-06 DIAGNOSIS — R079 Chest pain, unspecified: Secondary | ICD-10-CM | POA: Diagnosis present

## 2014-03-06 DIAGNOSIS — R0602 Shortness of breath: Secondary | ICD-10-CM | POA: Insufficient documentation

## 2014-03-06 DIAGNOSIS — R071 Chest pain on breathing: Secondary | ICD-10-CM

## 2014-03-06 MED ORDER — NAPROXEN 500 MG PO TABS: 500.0000 mg | ORAL_TABLET | Freq: Two times a day (BID) | ORAL | Status: DC

## 2014-03-06 NOTE — ED Provider Notes (Signed)
Medical screening examination/treatment/procedure(s) were performed by resident physician or non-physician practitioner and as supervising physician I was immediately available for consultation/collaboration.   Barkley Bruns MD.   Linna Hoff, MD 03/06/14 2051

## 2014-03-06 NOTE — Discharge Instructions (Signed)
Dolor de la pared torácica °(Chest Wall Pain) °Dolor en la pared torácica es dolor en o alrededor de los huesos y músculos de su pecho. Podrán pasar hasta 6 semanas hasta que comience a mejorar. Puede demorar más tiempo si es físicamente activo en su trabajo y actividades.  °CAUSAS  °El dolor en el pecho puede aparecer sin motivo. No obstante, algunas causas pueden ser:  °· Una enfermedad viral como la gripe. °· Traumatismos. °· Tos. °· La práctica de ejercicios. °· Artritis. °· Fibromialgia °· Culebrilla. °INSTRUCCIONES PARA EL CUIDADO DOMICILIARIO °· Evite hacer actividad física extenuante. Trate de no esforzarse o realizar actividades que le causen dolor. Aquí se incluyen las actividades en las que usa los músculos del tórax, los abdominales y los músculos laterales, especialmente si debe levantar objetos pesados. °· Aplique hielo sobre la zona dolorida. °¨ Ponga el hielo en una bolsa plástica. °¨ Colóquese una toalla entre la piel y la bolsa de hielo. °¨ Deje la bolsa de hielo durante 15 a 20 minutos por hora, durante los primeros 2 días. °· Utilice los medicamentos de venta libre o de prescripción para el dolor, el malestar o la fiebre, según se lo indique el profesional que lo asiste. °SOLICITE ATENCIÓN MÉDICA DE INMEDIATO SI: °· El dolor aumenta o siente muchas molestias. °· Tiene fiebre. °· El dolor de pecho empeora. °· Desarrolla nuevos e inexplicables síntomas. °· Tiene náuseas o vómitos. °· Transpira o se siente mareado. °· Tiene tos con flema (esputo), o tose con sangre. °ESTÉ SEGURO QUE:  °· Comprende las instrucciones para el alta médica. °· Controlará su enfermedad. °· Solicitará atención médica de inmediato según las indicaciones. °Document Released: 07/21/2006 Document Revised: 09/01/2011 °ExitCare® Patient Information ©2015 [employer]. This information is not intended to replace advice given to you by your health care provider. Make sure you discuss any questions you have with your health care  provider. ° °

## 2014-03-06 NOTE — ED Notes (Signed)
C/o right flank pain onset Saturday Pain increases when breathing and when pressure is applied Denies f/v/n/d, inj/trauma, urinary sx... Having normal BM Alert, no signs of acute distress.

## 2014-03-06 NOTE — ED Notes (Signed)
Pt has been adv to f/u at [facility] for xray... Pt verbalized understanding.

## 2014-03-06 NOTE — ED Provider Notes (Signed)
CSN: [id number]     Arrival date & time 03/06/14  1607 History   First MD Initiated Contact with Patient 03/06/14 1629     Chief Complaint  Patient presents with  . Flank Pain   (Consider location/radiation/quality/duration/timing/severity/associated sxs/prior Treatment) Patient is a 28 y.o. male presenting with flank pain.  Flank Pain Associated symptoms include chest pain (pleuritic chest wall pain).   28 year old male presents complaining of anterior right lateral rib cage pain. This started on Saturday. He denies any injury. He had a gradual onset of pain. The pain has been constant since yesterday, not worsening nor improving. It is increased with taking a deep inspiration. He denies any recent travel. No recent airplane travel or long car trips. No pain or swelling in his legs. The pain does not radiate. No chest pain at rest. No shortness of breath, nausea, vomiting, diaphoresis. No past medical history. He has never had this before.    Past Medical History  Diagnosis Date  . Back pain    History reviewed. No pertinent past surgical history. Family History  Problem Relation Age of Onset  . Other Mother     was treated for TB when she came to [place]  . Other Father     died accidental death  . Cancer Neg Hx   . Heart disease Neg Hx   . Stroke Neg Hx   . Diabetes Neg Hx    History  Substance Use Topics  . Smoking status: Never Smoker   . Smokeless tobacco: Never Used  . Alcohol Use: No    Review of Systems  Cardiovascular: Positive for chest pain (pleuritic chest wall pain).  Genitourinary: Negative for flank pain.  All other systems reviewed and are negative.   Allergies  Review of patient's allergies indicates no known allergies.  Home Medications   Prior to Admission medications   Medication Sig Start Date End Date Taking? Authorizing Provider  omeprazole (PRILOSEC) 40 MG capsule 1 tablet daily 45 min before breakfast 01/02/14  Yes Kermit Balo Tysinger, PA-C   naproxen (NAPROSYN) 500 MG tablet Take 1 tablet (500 mg total) by mouth 2 (two) times daily. 03/06/14   Adrian Blackwater Kathaleen Dudziak, PA-C   BP 122/77  Pulse 78  Temp(Src) 98.3 F (36.8 C) (Oral)  Resp 14  SpO2 100% Physical Exam  Nursing note and vitals reviewed. Constitutional: He is oriented to person, place, and time. He appears well-developed and well-nourished. No distress.  HENT:  Head: Normocephalic and atraumatic.  Right Ear: External ear normal.  Left Ear: External ear normal.  Nose: Nose normal.  Mouth/Throat: Oropharynx is clear and moist. No oropharyngeal exudate.  Eyes: Conjunctivae are normal. Right eye exhibits no discharge. Left eye exhibits no discharge.  Neck: Normal range of motion.  Cardiovascular: Normal rate, regular rhythm, normal heart sounds and intact distal pulses.  Exam reveals no gallop and no friction rub.   No murmur heard. Pulmonary/Chest: Effort normal and breath sounds normal. No respiratory distress. He has no wheezes. He has no rales. He exhibits tenderness.  Lymphadenopathy:    He has no cervical adenopathy.  Neurological: He is alert and oriented to person, place, and time. Coordination normal.  Skin: Skin is warm and dry. No rash noted. He is not diaphoretic.  Psychiatric: He has a normal mood and affect. Judgment normal.    ED Course  Procedures (including critical care time) Labs Review Labs Reviewed - No data to display  Imaging Review Dg Ribs Unilateral W/chest Right  03/06/2014   CLINICAL DATA:  Right ribcage pain and shortness of breath x3 days  EXAM: RIGHT RIBS AND CHEST - 3+ VIEW  COMPARISON:  Prior chest x-ray 02/01/2013  FINDINGS: The lungs are clear and negative for focal airspace consolidation, pulmonary edema or suspicious pulmonary nodule. No pleural effusion or pneumothorax. Cardiac and mediastinal contours are within normal limits. No acute fracture or lytic or blastic osseous lesions. The visualized upper abdominal bowel gas pattern is  unremarkable.  IMPRESSION: No active cardiopulmonary disease.   Electronically Signed   By: Malachy Moan M.D.   On: 03/06/2014 17:38     MDM   1. Chest wall pain    XR normal.  Do not suspect any sort of cardiac or pulmonary disease at this point. Treat symptomatically with NSAIDs for chest wall pain and costochondritis. Followup when necessary. ED if worsening.   Meds ordered this encounter  Medications  . naproxen (NAPROSYN) 500 MG tablet    Sig: Take 1 tablet (500 mg total) by mouth 2 (two) times daily.    Dispense:  60 tablet    Refill:  0    Order Specific Question:  Supervising Provider    Answer:  Lorenz Coaster, DAVID C [[id number]]       Graylon Good, PA-C 03/06/14 2000

## 2014-04-26 ENCOUNTER — Ambulatory Visit (INDEPENDENT_AMBULATORY_CARE_PROVIDER_SITE_OTHER): Payer: BC Managed Care – PPO | Admitting: Medical

## 2014-04-26 ENCOUNTER — Encounter: Payer: Self-pay | Admitting: Medical

## 2014-04-26 VITALS — BP 120/80 | HR 80 | Temp 98.0°F | Resp 16 | Wt 172.0 lb

## 2014-04-26 DIAGNOSIS — M7711 Lateral epicondylitis, right elbow: Secondary | ICD-10-CM

## 2014-04-26 DIAGNOSIS — M7551 Bursitis of right shoulder: Secondary | ICD-10-CM

## 2014-04-26 DIAGNOSIS — K219 Gastro-esophageal reflux disease without esophagitis: Secondary | ICD-10-CM

## 2014-04-26 MED ORDER — RANITIDINE HCL 150 MG PO TABS: 150.0000 mg | ORAL_TABLET | Freq: Every day | ORAL | Status: DC

## 2014-04-26 MED ORDER — IBUPROFEN 600 MG PO TABS: 600.0000 mg | ORAL_TABLET | Freq: Three times a day (TID) | ORAL | Status: AC | PRN

## 2014-04-26 MED ORDER — OMEPRAZOLE 40 MG PO CPDR: DELAYED_RELEASE_CAPSULE | ORAL | Status: DC

## 2014-04-26 NOTE — Patient Instructions (Signed)
  Thank you for giving me the opportunity to serve you today.    Your diagnosis today includes: Encounter Diagnoses  Name Primary?  . Shoulder bursitis, right Yes  . Epicondylitis, lateral (tennis elbow), right      Specific recommendations today include:  For the next 5-7 days use alternating ice and heat in the evenings on the right shoulder and elbow for pain and inflammation  Begin Ibuprofen 600mg , 1 tablet every 6-8 hours for pain and inflammation  Consider using an ARM SLING OTC, as needed for 1-2 hours at a time  Try and use the arm a little less than normal for the next week  If not much improved in 10-14 days, then let me know.

## 2014-04-26 NOTE — Progress Notes (Signed)
Subjective Here with translator today from [employer] for 2 complaints  For the last 2 weeks he has been having pain in his right shoulder and arm, worse with lifting things, but no recent trauma injury or fall. He is right-handed. 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  He currently reports pain generalized throughout the shoulder and upper arm and outside of the right elbow. He denies numbness tingling weakness. Using nothing for the symptoms. No prior similar. No neck issues no back issues  He is running out of his Prilosec prescribed last visit would like to continue this it has been helpful  Review of systems as in subjective   Objective: Filed Vitals:   04/26/14 1027  BP: 120/80  Pulse: 80  Temp: 98 F (36.7 C)  Resp: 16    General appearance: alert, no distress, WD/WN Neck: supple, no lymphadenopathy, no thyromegaly, no masses, normal ROM MSK: generalized tenderness of the right shoulder and upper back along the supraspinatus, tender along the rhomboids, tender along right upper arm, tender of AC joint and biceps tendon origin, tender over right lateral condyle,but normal range of motion of shoulder and arm, no deformity,negative special tests Arms neurovascularly intact    Assessment: Encounter Diagnoses  Name Primary?  . Shoulder bursitis, right Yes  . Epicondylitis, lateral (tennis elbow), right   . Gastroesophageal reflux disease without esophagitis      Plan: Shoulder bursitis, epicondylitis - discussed diagnosis, treatment, usual time frame to resolve.  Begin ibuprofen, alternating ice and heat, relative rest, consider arm sling, and if not improving in 7-10 days, recheck.   GERD - c/t 1 more month of Omeprazole, and then change to ranitidine   F/u with call back 2-3 wk.

## 2014-06-26 ENCOUNTER — Encounter (HOSPITAL_COMMUNITY): Payer: Self-pay | Admitting: Emergency Medicine

## 2014-06-26 ENCOUNTER — Emergency Department (HOSPITAL_COMMUNITY)
Admission: EM | Admit: 2014-06-26 | Discharge: 2014-06-26 | Disposition: A | Discharge status: Home / Self Care | Payer: 59 | Attending: Emergency Medicine | Admitting: Emergency Medicine

## 2014-06-26 ENCOUNTER — Emergency Department (HOSPITAL_COMMUNITY): Payer: 59

## 2014-06-26 DIAGNOSIS — J159 Unspecified bacterial pneumonia: Secondary | ICD-10-CM | POA: Insufficient documentation

## 2014-06-26 DIAGNOSIS — R000 Tachycardia, unspecified: Secondary | ICD-10-CM | POA: Insufficient documentation

## 2014-06-26 DIAGNOSIS — J189 Pneumonia, unspecified organism: Secondary | ICD-10-CM

## 2014-06-26 DIAGNOSIS — R05 Cough: Secondary | ICD-10-CM | POA: Diagnosis present

## 2014-06-26 MED ORDER — HYDROCODONE-ACETAMINOPHEN 7.5-325 MG/15ML PO SOLN: 15.0000 mL | ORAL | Status: AC | PRN

## 2014-06-26 MED ORDER — AZITHROMYCIN 250 MG PO TABS: 250.0000 mg | ORAL_TABLET | Freq: Every day | ORAL | Status: DC

## 2014-06-26 NOTE — ED Notes (Signed)
Pt. reports productive cough , chest congestion , runny nose and nasal congestion onset 2 days ago , denies fever or chills.

## 2014-06-26 NOTE — ED Provider Notes (Signed)
CSN: [id number]     Arrival date & time 06/26/14  1937 History  This chart was scribed for non-physician practitioner, Joycie Peek, PA-C working with Joya Gaskins, MD by Greggory Stallion, ED scribe. This patient was seen in room TR09C/TR09C and the patient's care was started at 9:13 PM.    Chief Complaint  Patient presents with  . Cough  . Nasal Congestion   The history is provided by the patient. No language interpreter was used.    HPI Comments: Juan Navarro is a 29 y.o. male who presents to the Emergency Department complaining of productive cough, nasal congestion, rhinorrhea and subjective fevers that started 2-4 days ago. Reports mild chest discomfort due to cough and night sweats. He has taken NyQuil with no relief. Denies hemoptysis, weight loss, abdominal pain, recent travel. Denies sick contacts. States he has an appointment with his PCP tomorrow. Pt received a flu shot about 3 months ago. Denies past medical history. Non smoker  Past Medical History  Diagnosis Date  . Back pain    History reviewed. No pertinent past surgical history. Family History  Problem Relation Age of Onset  . Other Mother     was treated for TB when she came to [place]  . Other Father     died accidental death  . Cancer Neg Hx   . Heart disease Neg Hx   . Stroke Neg Hx   . Diabetes Neg Hx    History  Substance Use Topics  . Smoking status: Never Smoker   . Smokeless tobacco: Never Used  . Alcohol Use: No    Review of Systems  Constitutional: Positive for fever.  HENT: Positive for congestion and rhinorrhea.   Respiratory: Positive for cough.   Gastrointestinal: Negative for abdominal pain.  All other systems reviewed and are negative.  Allergies  Review of patient's allergies indicates no known allergies.  Home Medications   Prior to Admission medications   Medication Sig Start Date End Date Taking? Authorizing Provider  ibuprofen (ADVIL,MOTRIN) 600 MG tablet Take 1 tablet  (600 mg total) by mouth every 8 (eight) hours as needed. Patient taking differently: Take 600 mg by mouth every 8 (eight) hours as needed for moderate pain.  04/26/14  Yes Kermit Balo Tysinger, PA-C  omeprazole (PRILOSEC) 40 MG capsule 1 tablet daily 45 min before breakfast 04/26/14  Yes Kermit Balo Tysinger, PA-C  azithromycin (ZITHROMAX) 250 MG tablet Take 1 tablet (250 mg total) by mouth daily. Take first 2 tablets together, then 1 every day until finished. 06/26/14   Sharlene Motts, PA-C  HYDROcodone-acetaminophen (HYCET) 7.5-325 mg/15 ml solution Take 15 mLs by mouth every 4 (four) hours as needed for moderate pain or severe pain. 06/26/14   Earle Gell Melody Cirrincione, PA-C  ranitidine (ZANTAC) 150 MG tablet Take 1 tablet (150 mg total) by mouth at bedtime. Patient not taking: Reported on 06/26/2014 04/26/14   Kermit Balo Tysinger, PA-C   BP 141/85 mmHg  Pulse 120  Temp(Src) 99.8 F (37.7 C) (Oral)  Resp 20  Ht  (1.778 m)  Wt 166 lb (75.297 kg)  BMI 23.82 kg/m2  SpO2 99%   Physical Exam  Constitutional: He is oriented to person, place, and time. He appears well-developed and well-nourished. No distress.  HENT:  Head: Normocephalic and atraumatic.  Mouth/Throat: Oropharynx is clear and moist and mucous membranes are normal.  Eyes: Conjunctivae and EOM are normal.  Neck: Neck supple. No tracheal deviation present.  Cardiovascular: Regular rhythm, S1  normal, S2 normal and normal heart sounds.  Tachycardia present.   Mildly tachycardic.  Pulmonary/Chest: Effort normal and breath sounds normal. No respiratory distress. He has no wheezes. He has no rhonchi. He has no rales.  Clear to auscultation bilaterally.  Abdominal: Soft. There is no tenderness.  Musculoskeletal: Normal range of motion.  Lymphadenopathy:    He has cervical adenopathy (left sided).  Neurological: He is alert and oriented to person, place, and time.  Skin: Skin is warm and dry.  Psychiatric: He has a normal mood and affect. His  behavior is normal.  Nursing note and vitals reviewed.   ED Course  Procedures (including critical care time)  DIAGNOSTIC STUDIES: Oxygen Saturation is 99% on RA, normal by my interpretation.    COORDINATION OF CARE: 9:19 PM-Discussed treatment plan which includes an antibiotic and cough medication with pt at bedside and pt agreed to plan. Advised pt to keep his PCP appointment tomorrow and follow up.  Labs Review Labs Reviewed - No data to display  Imaging Review Dg Chest 2 View  06/26/2014   CLINICAL DATA:  Cough, nasal congestion.  EXAM: CHEST  2 VIEW  COMPARISON:  Chest and rib series 03/06/2014  FINDINGS: Ill-defined linear and patchy airspace consolidation in the right middle lobe consistent with pneumonia. The left lung is clear. There is mild background bronchial wall thickening. Cardiomediastinal contours are normal. There is no pleural effusion or pneumothorax. No acute osseous abnormalities.  IMPRESSION: Right middle lobe pneumonia.   Electronically Signed   By: Rubye Oaks M.D.   On: 06/26/2014 20:55     EKG Interpretation None     Meds given in ED:  Medications - No data to display  Discharge Medication List as of 06/26/2014  9:22 PM    START taking these medications   Details  azithromycin (ZITHROMAX) 250 MG tablet Take 1 tablet (250 mg total) by mouth daily. Take first 2 tablets together, then 1 every day until finished., Starting 06/26/2014, Until Discontinued, Print    HYDROcodone-acetaminophen (HYCET) 7.5-325 mg/15 ml solution Take 15 mLs by mouth every 4 (four) hours as needed for moderate pain or severe pain., Starting 06/26/2014, Until Discontinued, Print       Filed Vitals:   06/26/14 1942 06/26/14 1944  BP:  141/85  Pulse:  120  Temp:  99.8 F (37.7 C)  TempSrc:  Oral  Resp:  20  Height: 5\' 10"  (1.778 m)   Weight: 166 lb (75.297 kg)   SpO2:  99%    MDM  Juan Navarro is a 29 y.o. male who presents to the Emergency Department complaining of  productive cough, nasal congestion, rhinorrhea and subjective fevers that started 2-4 days ago. Reports mild chest discomfort due to cough. No hemoptysis, weight loss, recent travel, abdominal pain.  Vitals stable - mildly tachycardic.  Pt resting comfortably in ED. PE--lungs are clear to auscultation bilaterally, no hypoxia or tachypnea. No evidence of respiratory distress.  Imaging--chest x-ray shows right middle lobe pneumonia  Pt with R middle lobe pneumonia, likely CAP. Will empirically treat with antibiotics, pain medicine. Pt is otherwise healthy with no comorbidities and is appropriate for outpatient treatment. Discussed f/u with PCP and return precautions, pt very amenable to plan. Patient stable, in good condition and ambulates out of the ED without difficulty.   Final diagnoses:  Community acquired pneumonia      I personally performed the services described in this documentation, which was scribed in my presence. The recorded information has  been reviewed and is accurate.  Earle Gell Selz, PA-C 06/27/14 1317  Joya Gaskins, MD 06/28/14 [phone]

## 2014-06-26 NOTE — ED Notes (Signed)
Pt reports cough and congestion and fever x 4 days. Pt states cough is productive and he has some blood in sputum. Pt denies any chills. Pt reports pain to chest when he coughs.

## 2014-06-26 NOTE — Discharge Instructions (Signed)
You were evaluated in the ED today for your cough and fever. It was found that you have a right middle lobe pneumonia. Please follow-up with your primary care for further evaluation and management of your symptoms. Please take your medications as prescribed. Please finish all of your antibiotics even if you begin to feel better. Return to ED for worsening symptoms.

## 2014-06-27 ENCOUNTER — Ambulatory Visit (INDEPENDENT_AMBULATORY_CARE_PROVIDER_SITE_OTHER): Payer: 59 | Admitting: Medical

## 2014-06-27 VITALS — BP 130/80 | HR 87 | Temp 98.3°F | Resp 16 | Wt 166.0 lb

## 2014-06-27 DIAGNOSIS — E781 Pure hyperglyceridemia: Secondary | ICD-10-CM

## 2014-06-27 DIAGNOSIS — J189 Pneumonia, unspecified organism: Secondary | ICD-10-CM

## 2014-06-27 MED ORDER — OMEGA-3-ACID ETHYL ESTERS 1 G PO CAPS: 2.0000 g | ORAL_CAPSULE | Freq: Two times a day (BID) | ORAL | Status: DC

## 2014-06-27 NOTE — Progress Notes (Signed)
Subjective: Here with interpreter Bishnu Paudel from language resources  Juan Navarro is a 29 y.o. male who presents for follow-up for pneumonia.   He went to the Emergency Department yesterday evening complaining of productive cough, nasal congestion, rhinorrhea and subjective fevers that started 2-4 days ago. Reports mild chest discomfort due to cough and night sweats. He has taken NyQuil with no relief. Denies hemoptysis, weight loss, abdominal pain, recent travel.  Denies sick contacts.  Non smoker  Review of systems as in subjective  Objective: BP 130/80 mmHg  Pulse 87  Temp(Src) 98.3 F (36.8 C) (Oral)  Resp 16  Wt 166 lb (75.297 kg)  General appearance: alert, no distress, WD/WN HEENT: normocephalic, sclerae anicteric, TMs pearly, nares patent, no discharge or erythema, pharynx normal Oral cavity: MMM, no lesions Neck: supple, no lymphadenopathy, no thyromegaly, no masses Heart: RRR, normal S1, S2, no murmurs Lungs: mildly decreased breath sounds lower fields, otherwise no wheezes, rhonchi, or rales   Assessment: Encounter Diagnoses  Name Primary?  . CAP (community acquired pneumonia) Yes  . Hypertriglyceridemia     Plan: CAP -  Reviewed CXR and ED report from yesterday's visit.  Discussed his recent diagnosis, treatment, precautions, time to recover.   Continue Zpak, hycodan prn, OTC Robitussin.  F/u in 6 wk, consider repeat CXR, but return sooner if worse, fever, SOB, wheezing.    hypertriglyceridemia - restart Lovaza, f/u 6wk fasting.

## 2014-07-02 ENCOUNTER — Telehealth: Payer: Self-pay | Admitting: Medical

## 2014-07-03 NOTE — Telephone Encounter (Signed)
P.A. LOVAZA approved til 07/03/15, faxed pharmacy, left message for pt

## 2014-08-08 ENCOUNTER — Ambulatory Visit
Admission: RE | Admit: 2014-08-08 | Discharge: 2014-08-08 | Disposition: A | Discharge status: Home / Self Care | Payer: 59 | Source: Ambulatory Visit | Attending: Medical | Admitting: Medical

## 2014-08-08 ENCOUNTER — Ambulatory Visit (INDEPENDENT_AMBULATORY_CARE_PROVIDER_SITE_OTHER): Payer: 59 | Admitting: Medical

## 2014-08-08 ENCOUNTER — Other Ambulatory Visit: Payer: Self-pay | Admitting: Medical

## 2014-08-08 ENCOUNTER — Encounter: Payer: Self-pay | Admitting: Medical

## 2014-08-08 VITALS — BP 130/90 | HR 72 | Temp 98.2°F | Resp 16 | Wt 170.0 lb

## 2014-08-08 DIAGNOSIS — R05 Cough: Secondary | ICD-10-CM

## 2014-08-08 DIAGNOSIS — E782 Mixed hyperlipidemia: Secondary | ICD-10-CM | POA: Diagnosis not present

## 2014-08-08 DIAGNOSIS — J189 Pneumonia, unspecified organism: Secondary | ICD-10-CM

## 2014-08-08 DIAGNOSIS — R61 Generalized hyperhidrosis: Secondary | ICD-10-CM

## 2014-08-08 DIAGNOSIS — R059 Cough, unspecified: Secondary | ICD-10-CM

## 2014-08-08 LAB — CBC
HCT: 43.7 % (ref 39.0–52.0)
Hemoglobin: 14.8 g/dL (ref 13.0–17.0)
MCH: 28.6 pg (ref 26.0–34.0)
MCHC: 33.9 g/dL (ref 30.0–36.0)
MCV: 84.4 fL (ref 78.0–100.0)
MPV: 10.8 fL (ref 8.6–12.4)
PLATELETS: 243 10*3/uL (ref 150–400)
RBC: 5.18 MIL/uL (ref 4.22–5.81)
RDW: 14.4 % (ref 11.5–15.5)
WBC: 6.2 10*3/uL (ref 4.0–10.5)

## 2014-08-08 LAB — LIPID PANEL
CHOL/HDL RATIO: 6.7 ratio
Cholesterol: 235 mg/dL — ABNORMAL HIGH (ref 0–200)
HDL: 35 mg/dL — ABNORMAL LOW (ref >39)
TRIGLYCERIDES: 491 mg/dL — AB (ref <150)

## 2014-08-08 MED ORDER — ATORVASTATIN CALCIUM 20 MG PO TABS: 20.0000 mg | ORAL_TABLET | Freq: Every day | ORAL | Status: DC

## 2014-08-08 MED ORDER — ICOSAPENT ETHYL 1 G PO CAPS: 2.0000 g | ORAL_CAPSULE | Freq: Two times a day (BID) | ORAL | Status: DC

## 2014-08-08 NOTE — Addendum Note (Signed)
Addended by: Janeice RobinsonSCALES, Roshaun Pound L on: 08/08/2014 10:41 AM   Modules accepted: Orders

## 2014-08-08 NOTE — Progress Notes (Signed)
Subjective: Here with interpreter Taran Thapa from language resources  Juan Navarro is a 29 y.o. male who presents for follow-up for pneumonia.   I saw him 06/27/2014 for pneumonia and he had been seen in the emergency department prior to me seeing him.  He has completed the antibiotic.  Still has some cough, no SOB, wheezing, congestion, weak.  Not taking anything for cough.  Also here in follow-up on mixed dyslipidemia.  Since last visit he is compliant with Lovaza twice daily.  Eating healthy, nonsmoker.    He notes some recent night sweats only since the pneumonia.  Denies fever, night sweats.  He notes having testing for TB when he came to this country.  Was reportedly negative for TB.  However, his father long time ago was treated for TB back in [place].  Review of systems as in subjective  Objective: BP 130/90 mmHg  Pulse 72  Temp(Src) 98.2 F (36.8 C) (Oral)  Resp 16  Wt 170 lb (77.111 kg)   Wt Readings from Last 3 Encounters:  08/08/14 170 lb (77.111 kg)  06/27/14 166 lb (75.297 kg)  06/26/14 166 lb (75.297 kg)   General appearance: alert, no distress, WD/WN HEENT: normocephalic, sclerae anicteric, TMs pearly, nares patent, no discharge or erythema, pharynx normal Oral cavity: MMM, no lesions Neck: supple, no lymphadenopathy, no thyromegaly, no masses Heart: RRR, normal S1, S2, no murmurs Lungs: relatively clear, no wheezes, rhonchi, or rales Ext: no edema Abdomen: +bs, soft, nontender, no mass, no organomegaly   Assessment: Encounter Diagnoses  Name Primary?  . CAP (community acquired pneumonia) Yes  . Mixed dyslipidemia   . Night sweats   . Cough     Plan: CAP -  Clinically still has a little cough and night sweats.   Will send for repeat CXR and CBC today.    Mixed dyslipidemia -compliant with Lovaza, fasting labs today.   Consider other therapy as the copay is $80.  Night sweats - may be residual from recent pneumonia.   Given father's hx/o TB and  symptoms, PPD screen today.  He reports negative screen back when he first came to this country.  Return in 48-72 hours for ppd placement.   Cough - go for repeat CXR, may just be residual from pneumonia in January.  Clinically much improved.

## 2014-08-10 ENCOUNTER — Telehealth: Payer: Self-pay

## 2014-08-10 LAB — TB SKIN TEST: TB Skin Test: POSITIVE

## 2014-08-10 NOTE — Telephone Encounter (Signed)
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 I also advised him that he could make an appointment with an interpreter to come back and talk with Vincenza HewsShane before referring to the health dept and he declined stating he understood. I told him we would be calling with an appointment in the next couple of days and if he had any questions to call us. Vincenza HewsShane, will you please advise Karren BurlyChandra on how to follow up with this case?

## 2014-08-11 NOTE — Telephone Encounter (Signed)
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 I was advised from Tammy to Fax over the patient's information to fax # [phone]. I fax over demographics, test results and CXR report. CLS

## 2014-08-24 ENCOUNTER — Telehealth: Payer: Self-pay | Admitting: Medical

## 2014-08-24 NOTE — Telephone Encounter (Signed)
P.A. Tillman SersVascepa approved til 08/24/15, called pharmacy and cost is $80.  Activated discount card & faxed to pharmacy & they are to rerun Rx.  Also called pt and he will go pick up Rx.  FYI also got price for second choice Generic Lopid #60 pills cash price is $26.64

## 2014-10-24 ENCOUNTER — Encounter: Payer: Self-pay | Admitting: Medical

## 2014-10-24 ENCOUNTER — Encounter: Payer: Self-pay | Admitting: Internal Medicine

## 2014-11-10 ENCOUNTER — Telehealth: Payer: Self-pay | Admitting: Medical

## 2014-11-10 NOTE — Telephone Encounter (Signed)
Pt called for appt for chest pain & runny nose that started yesterday.  Advised no appts available and advised to go to Urgent Care or ED

## 2014-12-21 ENCOUNTER — Ambulatory Visit (INDEPENDENT_AMBULATORY_CARE_PROVIDER_SITE_OTHER): Payer: 59 | Admitting: Medical

## 2014-12-21 ENCOUNTER — Encounter: Payer: Self-pay | Admitting: Medical

## 2014-12-21 VITALS — BP 110/70 | HR 72 | Temp 98.0°F | Resp 15 | Wt 170.0 lb

## 2014-12-21 DIAGNOSIS — E782 Mixed hyperlipidemia: Secondary | ICD-10-CM | POA: Diagnosis not present

## 2014-12-21 LAB — LIPID PANEL
Cholesterol: 173 mg/dL (ref 0–200)
HDL: 27 mg/dL — ABNORMAL LOW (ref >=40)
Total CHOL/HDL Ratio: 6.4 Ratio
Triglycerides: 427 mg/dL — ABNORMAL HIGH (ref <150)

## 2014-12-21 LAB — COMPREHENSIVE METABOLIC PANEL
ALBUMIN: 4.3 g/dL (ref 3.5–5.2)
ALT: 23 U/L (ref 0–53)
AST: 21 U/L (ref 0–37)
Alkaline Phosphatase: 76 U/L (ref 39–117)
BILIRUBIN TOTAL: 0.8 mg/dL (ref 0.2–1.2)
BUN: 12 mg/dL (ref 6–23)
CO2: 23 meq/L (ref 19–32)
Calcium: 9.2 mg/dL (ref 8.4–10.5)
Chloride: 107 mEq/L (ref 96–112)
Creat: 1.26 mg/dL (ref 0.50–1.35)
GLUCOSE: 82 mg/dL (ref 70–99)
Potassium: 4.3 mEq/L (ref 3.5–5.3)
SODIUM: 141 meq/L (ref 135–145)
TOTAL PROTEIN: 7.4 g/dL (ref 6.0–8.3)

## 2014-12-21 NOTE — Progress Notes (Signed)
Subjective: Here for f/u on mixed dyslipidemia.  Since the diagnosis over a year ago, he has made significant change with diet and exercise.  Eats healthy, no red meat, switched to brown rice instead of white, eats vegetable.   He has been taking Vascepa 2 capsules BID since last visit here, but ran out 2 wk ago.  No family hx/o dyslipidemia or high triglycerides.  No other aggravating or relieving factors. No other complaint.  Past Medical History  Diagnosis Date  . Back pain   . Mixed dyslipidemia    ROS as in subjective   Objective BP 110/70 mmHg  Pulse 72  Temp(Src) 98 F (36.7 C) (Oral)  Resp 15  Wt 170 lb (77.111 kg)  General appearance: alert, no distress, WD/WN Neck: supple, no lymphadenopathy, no thyromegaly, no masses Heart: RRR, normal S1, S2, no murmurs Lungs: CTA bilaterally, no wheezes, rhonchi, or rales Abdomen: +bs, soft, non tender, non distended, no masses, no hepatomegaly, no splenomegaly Pulses: 2+ symmetric, upper and lower extremities, normal cap refill Ext: no edema   Assessment: Encounter Diagnosis  Name Primary?  . Mixed dyslipidemia Yes   Plan: Labs today.   Discussed if labs still abnormal, may need to stay on this medication.  Glad to hear he is eating healthy and exercising.   Advised his siblings have lipid panel as well.  I suspect a genetic component to his dyslipidemia.

## 2014-12-26 ENCOUNTER — Other Ambulatory Visit: Payer: Self-pay | Admitting: Medical

## 2014-12-26 MED ORDER — NIACIN ER (ANTIHYPERLIPIDEMIC) 500 MG PO TBCR: 500.0000 mg | EXTENDED_RELEASE_TABLET | Freq: Every day | ORAL | Status: AC

## 2014-12-27 ENCOUNTER — Encounter: Payer: Self-pay | Admitting: Family Medicine

## 2016-12-26 IMAGING — CR DG CHEST 2V
2 series · 2 of 2 positions shown · non-contrast
Comparison: Chest x-ray 06/26/2014

CLINICAL DATA: Residual cough, history of pneumonia in [REDACTED]

EXAM:
CHEST  2 VIEW

[view not recorded (1 of 2)]
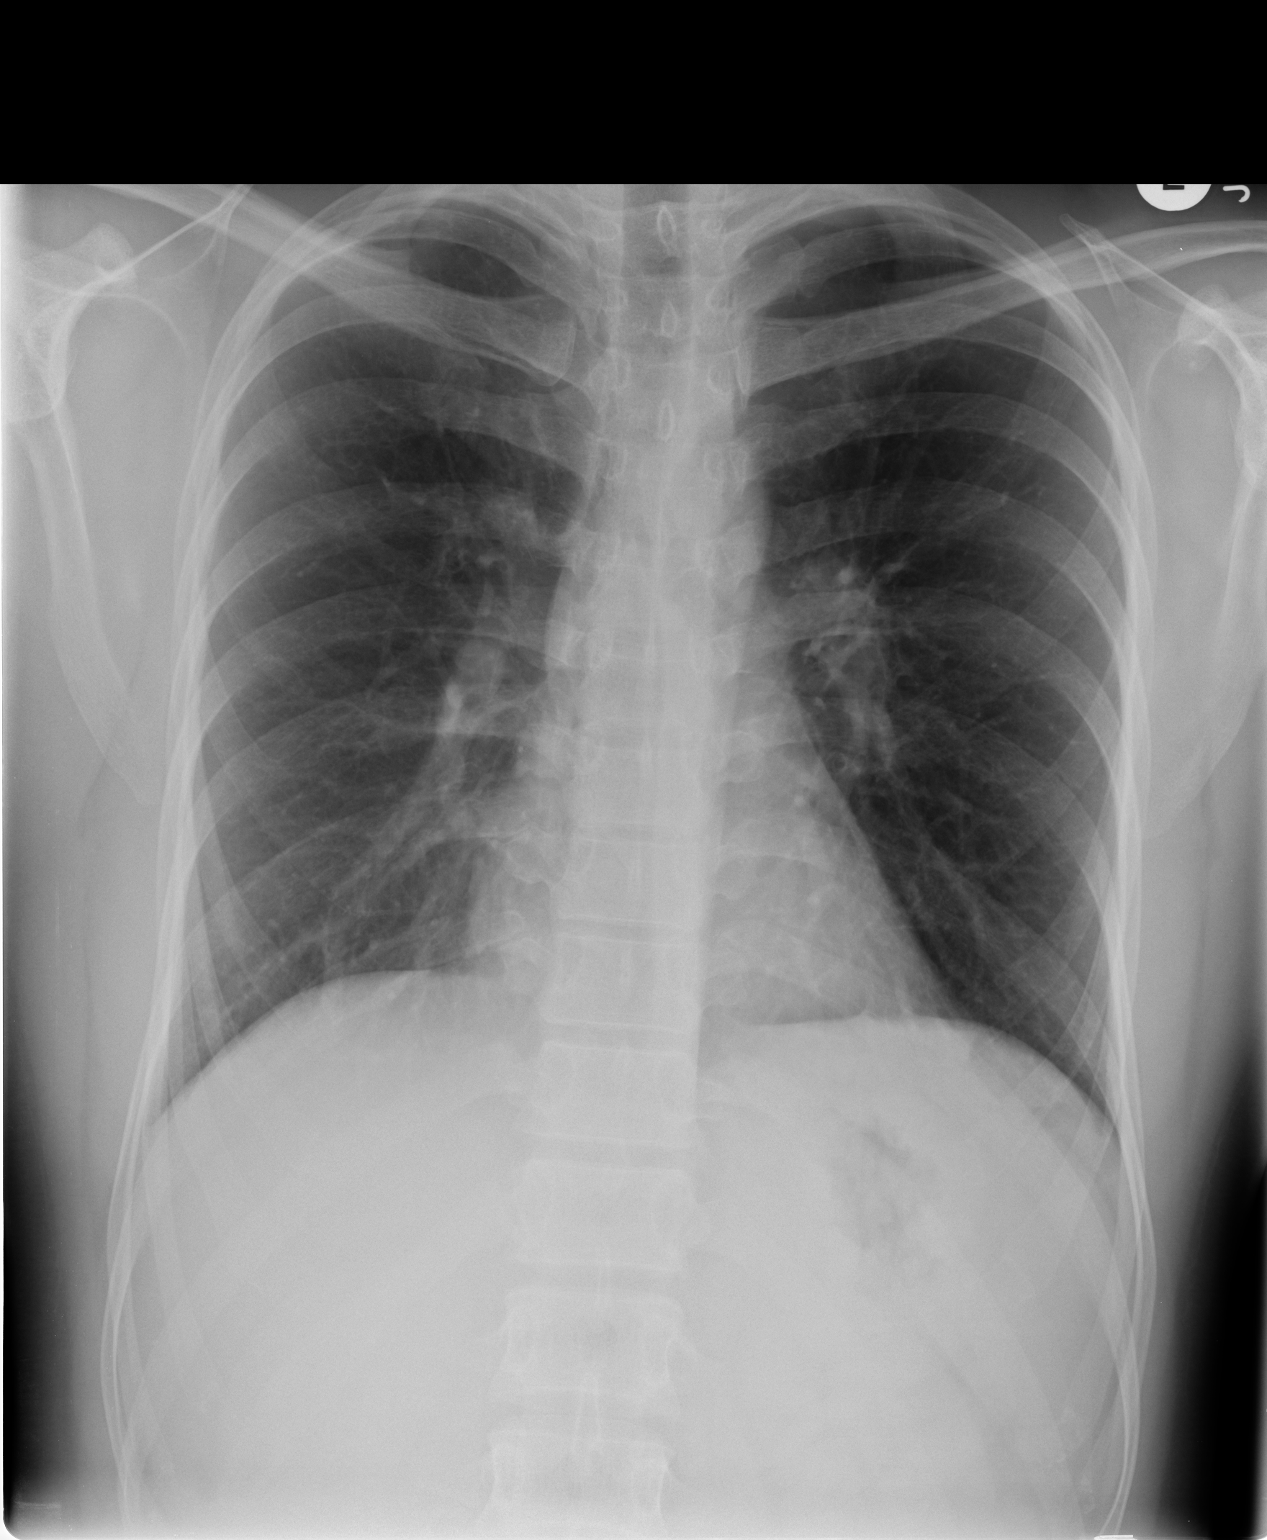

[view not recorded (2 of 2)]
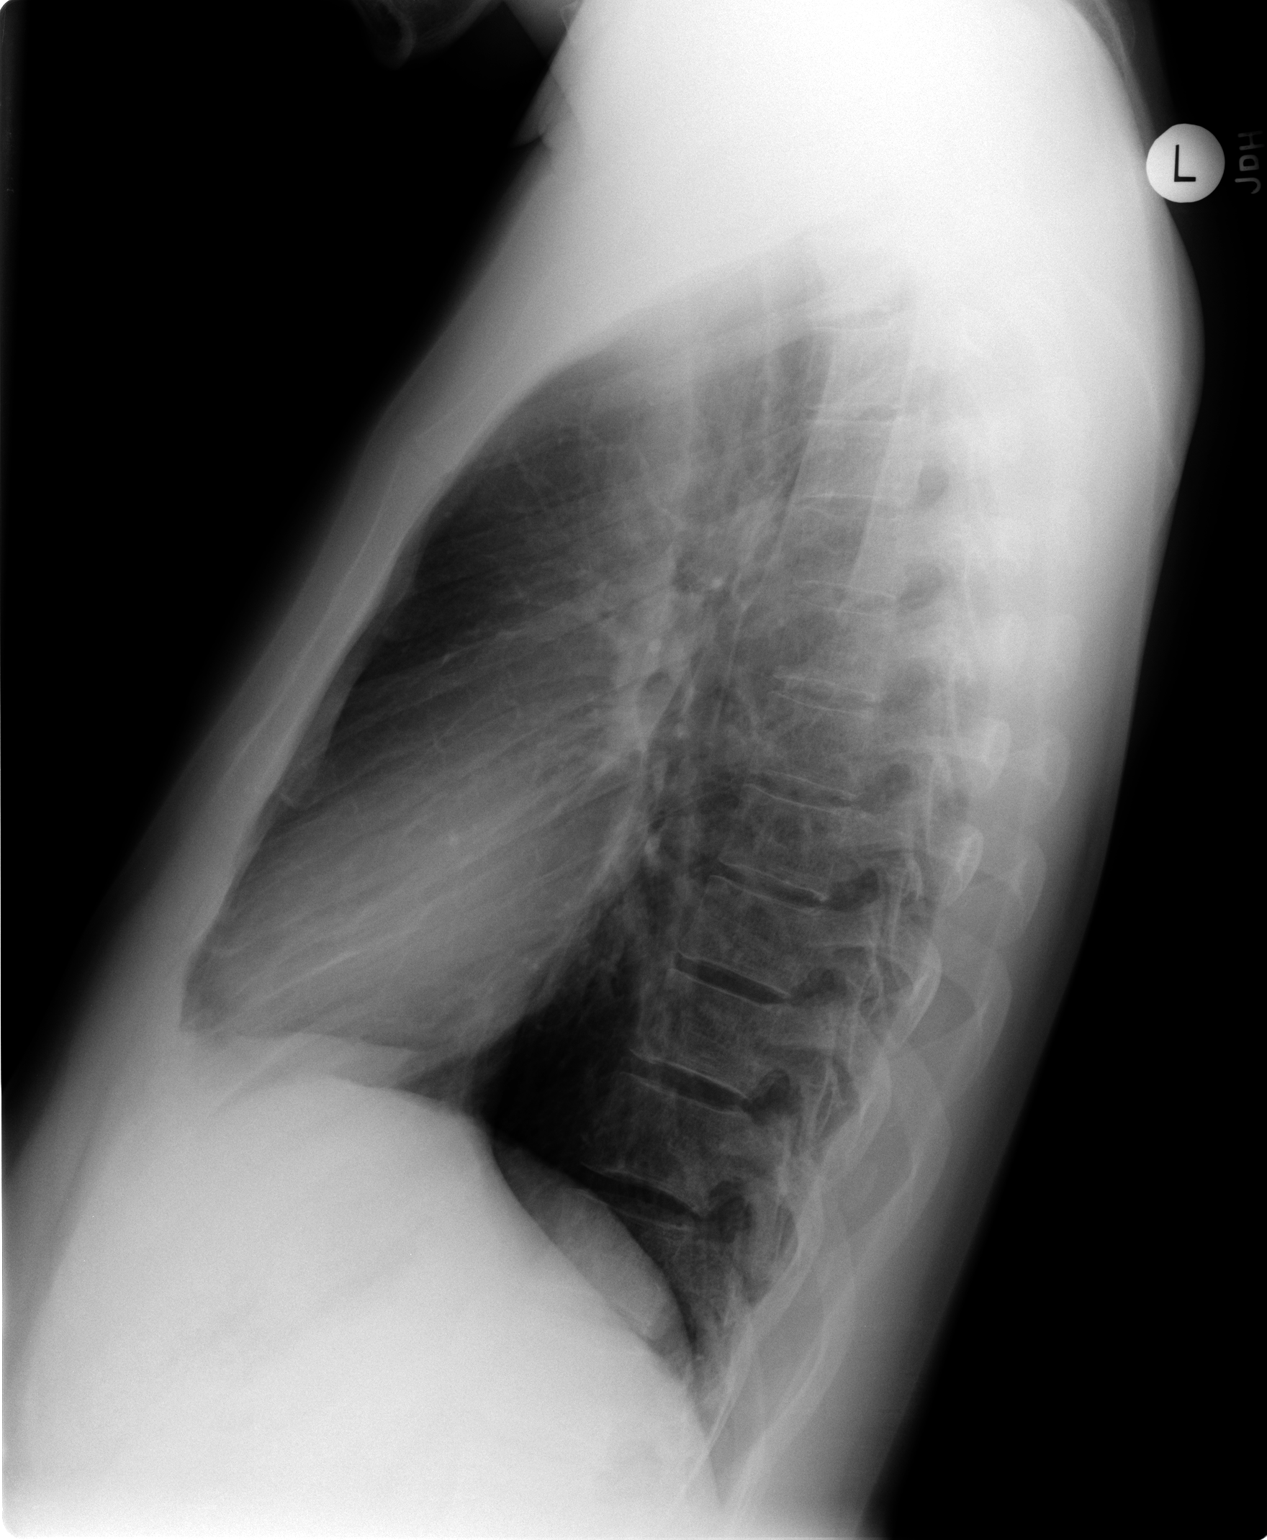

[2 of 2 positions shown; findings below may reference images not displayed]

FINDINGS: The previously noted right middle lobe pneumonia has cleared. No
infiltrate or effusion is seen. Mediastinal and hilar contours are
unremarkable. The heart is within normal limits in size. No bony
abnormality is seen.
IMPRESSION: No active cardiopulmonary disease.
